# Patient Record
Sex: Female | Born: 1970 | Hispanic: Yes | Marital: Married | Smoking: Never smoker
Health system: Southern US, Community
[De-identification: ages and names within clinical notes are randomized; demographics above are authoritative.]

## PROBLEM LIST (undated history)

## (undated) HISTORY — PX: CHOLECYSTECTOMY: SHX55

---

## 2016-03-02 ENCOUNTER — Emergency Department
Admission: EM | Admit: 2016-03-02 | Discharge: 2016-03-02 | Disposition: A | Discharge status: Home / Self Care | Payer: BLUE CROSS/BLUE SHIELD | Source: Home / Self Care | Attending: Family Medicine | Admitting: Family Medicine

## 2016-03-02 ENCOUNTER — Encounter: Payer: Self-pay | Admitting: Emergency Medicine

## 2016-03-02 DIAGNOSIS — R059 Cough, unspecified: Secondary | ICD-10-CM

## 2016-03-02 DIAGNOSIS — Z9109 Other allergy status, other than to drugs and biological substances: Secondary | ICD-10-CM

## 2016-03-02 DIAGNOSIS — Z91048 Other nonmedicinal substance allergy status: Secondary | ICD-10-CM | POA: Diagnosis not present

## 2016-03-02 DIAGNOSIS — R05 Cough: Secondary | ICD-10-CM | POA: Diagnosis not present

## 2016-03-02 DIAGNOSIS — R0981 Nasal congestion: Secondary | ICD-10-CM | POA: Diagnosis not present

## 2016-03-02 MED ORDER — BENZONATATE 100 MG PO CAPS: 100.0000 mg | ORAL_CAPSULE | Freq: Three times a day (TID) | ORAL | Status: DC

## 2016-03-02 MED ORDER — PSEUDOEPHEDRINE-GUAIFENESIN 60-400 MG PO TABS: ORAL_TABLET | ORAL | Status: DC

## 2016-03-02 MED ORDER — FLUTICASONE PROPIONATE 50 MCG/ACT NA SUSP: 2.0000 | Freq: Every day | NASAL | Status: AC

## 2016-03-02 NOTE — ED Provider Notes (Signed)
CSN: [id number]     Arrival date & time 03/02/16  1043 History   First MD Initiated Contact with Patient 03/02/16 1101     Chief Complaint  Patient presents with  . Cough   (Consider location/radiation/quality/duration/timing/severity/associated sxs/prior Treatment) HPI  The pt is a 45yo female presenting to [facility] with c/o 3 days of nasal congestion, mild intermittent dry cough, itchy scratchy throat and slight chills yesterday.  She has been taking Zyrtec without relief. Husband states he has been having symptoms due to his seasonal allergies.  Cough and throat are most bothersome for the pt. Mild to moderate in severity. Denies n/v/d.   History reviewed. No pertinent past medical history. History reviewed. No pertinent past surgical history. No family history on file. Social History  Substance Use Topics  . Smoking status: Never Smoker   . Smokeless tobacco: None  . Alcohol Use: No   OB History    No data available     Review of Systems  Constitutional: Positive for chills. Negative for fever.  HENT: Positive for congestion, rhinorrhea and sore throat. Negative for ear pain, trouble swallowing and voice change.   Respiratory: Positive for cough. Negative for shortness of breath.   Cardiovascular: Negative for chest pain and palpitations.  Gastrointestinal: Negative for nausea, vomiting, abdominal pain and diarrhea.  Musculoskeletal: Negative for myalgias, back pain and arthralgias.  Skin: Negative for rash.    Allergies  Review of patient's allergies indicates no known allergies.  Home Medications   Prior to Admission medications   Medication Sig Start Date End Date Taking? Authorizing Provider  cetirizine (ZYRTEC) 10 MG tablet Take 10 mg by mouth daily.   Yes Historical Provider, MD  benzonatate (TESSALON) 100 MG capsule Take 1-2 capsules (100-200 mg total) by mouth every 8 (eight) hours. 03/02/16   Junius Finner, PA-C  fluticasone (FLONASE) 50 MCG/ACT nasal spray Place 2  sprays into both nostrils daily. 03/02/16   Junius Finner, PA-C  Pseudoephedrine-Guaifenesin 60-400 MG TABS 1 tab by mouth every 6-8 hours for cough and congestion. Take with full glass of water. 03/02/16   Junius Finner, PA-C   Meds Ordered and Administered this Visit  Medications - No data to display  BP 124/79 mmHg  Pulse 79  Temp(Src) 98.5 F (36.9 C) (Oral)  Ht  (1.549 m)  Wt 181 lb (82.101 kg)  BMI 34.22 kg/m2  SpO2 98%  LMP 03/01/2016 No data found.   Physical Exam  Constitutional: She appears well-developed and well-nourished. No distress.  HENT:  Head: Normocephalic and atraumatic.  Right Ear: Tympanic membrane normal.  Left Ear: Tympanic membrane normal.  Nose: Mucosal edema and rhinorrhea present. Right sinus exhibits no maxillary sinus tenderness and no frontal sinus tenderness. Left sinus exhibits no maxillary sinus tenderness and no frontal sinus tenderness.  Mouth/Throat: Uvula is midline and mucous membranes are normal. Posterior oropharyngeal erythema present. No oropharyngeal exudate, posterior oropharyngeal edema or tonsillar abscesses.  Eyes: Conjunctivae are normal. No scleral icterus.  Neck: Normal range of motion. Neck supple.  Cardiovascular: Normal rate, regular rhythm and normal heart sounds.   Pulmonary/Chest: Effort normal and breath sounds normal. No stridor. No respiratory distress. She has no wheezes. She has no rales.  Abdominal: Soft. She exhibits no distension. There is no tenderness.  Musculoskeletal: Normal range of motion.  Lymphadenopathy:    She has no cervical adenopathy.  Neurological: She is alert.  Skin: Skin is warm and dry. She is not diaphoretic.  Nursing note and vitals  reviewed.   ED Course  Procedures (including critical care time)  Labs Review Labs Reviewed - No data to display  Imaging Review No results found.   MDM   1. Cough   2. Nasal congestion   3. Environmental allergies    Pt c/o mild cough with sinus  congestion and scratchy throat. No evidence of bacterial infection on exam. Symptoms likely viral vs seasonal/environmental allergies. Encouraged symptomatic treatment.   Rx: tessalon, Flonase, and pseudoephedrine-guaifenesin  Encouraged fluids and rest. F/u with PCP in 1 week if not improving, sooner if worsening. Patient verbalized understanding and agreement with treatment plan.     Junius Finnerrin O'Malley, PA-C 03/02/16 1118

## 2016-03-02 NOTE — ED Notes (Signed)
Cough, itchy throat, runny nose, congestion x 3 days, slight chills yeserday

## 2016-05-03 ENCOUNTER — Emergency Department
Admission: EM | Admit: 2016-05-03 | Discharge: 2016-05-03 | Disposition: A | Discharge status: Home / Self Care | Payer: BLUE CROSS/BLUE SHIELD | Source: Home / Self Care | Attending: Family Medicine | Admitting: Family Medicine

## 2016-05-03 ENCOUNTER — Emergency Department (INDEPENDENT_AMBULATORY_CARE_PROVIDER_SITE_OTHER): Payer: BLUE CROSS/BLUE SHIELD

## 2016-05-03 ENCOUNTER — Encounter: Payer: Self-pay | Admitting: *Deleted

## 2016-05-03 DIAGNOSIS — S99911A Unspecified injury of right ankle, initial encounter: Secondary | ICD-10-CM | POA: Diagnosis not present

## 2016-05-03 DIAGNOSIS — W109XXA Fall (on) (from) unspecified stairs and steps, initial encounter: Secondary | ICD-10-CM

## 2016-05-03 DIAGNOSIS — S93401A Sprain of unspecified ligament of right ankle, initial encounter: Secondary | ICD-10-CM

## 2016-05-03 NOTE — ED Provider Notes (Signed)
CSN: [id number]     Arrival date & time 05/03/16  1056 History   First MD Initiated Contact with Patient 05/03/16 1115     Chief Complaint  Patient presents with  . Ankle Pain      HPI Comments: Patient twisted her right ankle on stairs 30 minutes ago.  Patient is a 45 y.o. female presenting with ankle pain. The history is provided by the patient and a relative.  Ankle Pain Location:  Ankle Time since incident:  30 minutes Injury: yes   Mechanism of injury: fall   Fall:    Fall occurred:  Down stairs   Impact surface:  Hard floor   Entrapped after fall: no   Ankle location:  R ankle Pain details:    Quality:  Aching   Radiates to:  Does not radiate   Severity:  Moderate   Onset quality:  Sudden   Progression:  Unchanged Chronicity:  New Prior injury to area:  No Relieved by:  None tried Worsened by:  Bearing weight Ineffective treatments:  None tried Associated symptoms: decreased ROM, stiffness and swelling   Associated symptoms: no muscle weakness, no numbness and no tingling     History reviewed. No pertinent past medical history. History reviewed. No pertinent past surgical history. History reviewed. No pertinent family history. Social History  Substance Use Topics  . Smoking status: Never Smoker   . Smokeless tobacco: None  . Alcohol Use: No   OB History    No data available     Review of Systems  Musculoskeletal: Positive for stiffness.  All other systems reviewed and are negative.   Allergies  Review of patient's allergies indicates no known allergies.  Home Medications   Prior to Admission medications   Medication Sig Start Date End Date Taking? Authorizing Provider  cetirizine (ZYRTEC) 10 MG tablet Take 10 mg by mouth daily.    Historical Provider, MD  fluticasone (FLONASE) 50 MCG/ACT nasal spray Place 2 sprays into both nostrils daily. 03/02/16   Junius FinnerErin O'Malley, PA-C   Meds Ordered and Administered this Visit  Medications - No data to  display  BP 115/76 mmHg  Pulse 72  Temp(Src) 97.7 F (36.5 C) (Oral)  Resp 16  Ht 5\' 3"  (1.6 m)  Wt 180 lb (81.647 kg)  BMI 31.89 kg/m2  SpO2 97%  LMP 04/02/2016 No data found.   Physical Exam  Constitutional: She is oriented to person, place, and time. She appears well-developed and well-nourished. No distress.  HENT:  Head: Atraumatic.  Eyes: Pupils are equal, round, and reactive to light.  Musculoskeletal:       Right ankle: She exhibits decreased range of motion. She exhibits no swelling, no ecchymosis, no deformity, no laceration and normal pulse. Tenderness. Lateral malleolus and AITFL tenderness found. No medial malleolus, no head of 5th metatarsal and no proximal fibula tenderness found. Achilles tendon normal.       Feet:   Right ankle:  Decreased range of motion.  Tenderness and swelling over the lateral malleolus.  Joint stable.  No tenderness over the base of the fifth metatarsal.  Distal neurovascular function is intact.   Neurological: She is alert and oriented to person, place, and time.  Skin: Skin is warm and dry.  Nursing note and vitals reviewed.   ED Course  Procedures none   Imaging Review Dg Ankle Complete Right  05/03/2016  CLINICAL DATA:  45 year old female with right lateral ankle pain after falling down stairs this morning. Initial  encounter. EXAM: RIGHT ANKLE - COMPLETE 3+ VIEW COMPARISON:  None. FINDINGS: Bone mineralization is within normal limits. Preserved mortise joint alignment. Talar dome intact. No joint effusion identified. Calcaneus intact with plantar surface degenerative spurring. Distal tibia and fibula appear intact. No fracture identified in the right foot. There is anterior soft tissue swelling and stranding along the ankle. IMPRESSION: Anterior soft tissue injury with no acute fracture or dislocation identified about the right ankle. Electronically Signed   By: Odessa Fleming M.D.   On: 05/03/2016 11:41       MDM   1. Right ankle  sprain, initial encounter     Ace wrap applied.  Dispensed crutches and AirCast stirrup splint.  Apply ice pack for 30 minutes every 1 to 2 hours today and tomorrow.  Elevate.  Use crutches for 3 to 5 days.  Wear Ace wrap until swelling decreases.  Wear brace for about 2 to 3 weeks.  Begin range of motion and stretching exercises in about 5 days as per instruction sheet.  May take Ibuprofen , 4 tabs every 8 hours with food.  Followup with Dr. Rodney Langton or Dr. Clementeen [facility] ([facility]) if not improving about two weeks.    Lattie Haw, MD 05/03/16 1201

## 2016-05-03 NOTE — ED Notes (Signed)
Pt c/o RT ankle pain post fall down the stairs at home at 1045 today. No OTC meds.

## 2016-05-03 NOTE — Discharge Instructions (Signed)
Apply ice pack for 30 minutes every 1 to 2 hours today and tomorrow.  Elevate.  Use crutches for 3 to 5 days.  Wear Ace wrap until swelling decreases.  Wear brace for about 2 to 3 weeks.  Begin range of motion and stretching exercises in about 5 days as per instruction sheet.  May take Ibuprofen 200mg, 4 tabs every 8 hours with food.  ° ° °Ankle Sprain °An ankle sprain is an injury to the strong, fibrous tissues (ligaments) that hold the bones of your ankle joint together.  °CAUSES °An ankle sprain is usually caused by a fall or by twisting your ankle. Ankle sprains most commonly occur when you step on the outer edge of your foot, and your ankle turns inward. People who participate in sports are more prone to these types of injuries.  °SYMPTOMS  °· Pain in your ankle. The pain may be present at rest or only when you are trying to stand or walk. °· Swelling. °· Bruising. Bruising may develop immediately or within 1 to 2 days after your injury. °· Difficulty standing or walking, particularly when turning corners or changing directions. °DIAGNOSIS  °Your caregiver will ask you details about your injury and perform a physical exam of your ankle to determine if you have an ankle sprain. During the physical exam, your caregiver will press on and apply pressure to specific areas of your foot and ankle. Your caregiver will try to move your ankle in certain ways. An X-ray exam may be done to be sure a bone was not broken or a ligament did not separate from one of the bones in your ankle (avulsion fracture).  °TREATMENT  °Certain types of braces can help stabilize your ankle. Your caregiver can make a recommendation for this. Your caregiver may recommend the use of medicine for pain. If your sprain is severe, your caregiver may refer you to a surgeon who helps to restore function to parts of your skeletal system (orthopedist) or a physical therapist. °HOME CARE INSTRUCTIONS  °· Apply ice to your injury for 1-2 days or as  directed by your caregiver. Applying ice helps to reduce inflammation and pain. °¨ Put ice in a plastic bag. °¨ Place a towel between your skin and the bag. °¨ Leave the ice on for 15-20 minutes at a time, every 2 hours while you are awake. °· Only take over-the-counter or prescription medicines for pain, discomfort, or fever as directed by your caregiver. °· Elevate your injured ankle above the level of your heart as much as possible for 2-3 days. °· If your caregiver recommends crutches, use them as instructed. Gradually put weight on the affected ankle. Continue to use crutches or a cane until you can walk without feeling pain in your ankle. °· If you have a plaster splint, wear the splint as directed by your caregiver. Do not rest it on anything harder than a pillow for the first 24 hours. Do not put weight on it. Do not get it wet. You may take it off to take a shower or bath. °· You may have been given an elastic bandage to wear around your ankle to provide support. If the elastic bandage is too tight (you have numbness or tingling in your foot or your foot becomes cold and blue), adjust the bandage to make it comfortable. °· If you have an air splint, you may blow more air into it or let air out to make it more comfortable. You may take   your splint off at night and before taking a shower or bath. Wiggle your toes in the splint several times per day to decrease swelling. °SEEK MEDICAL CARE IF:  °· You have rapidly increasing bruising or swelling. °· Your toes feel extremely cold or you lose feeling in your foot. °· Your pain is not relieved with medicine. °SEEK IMMEDIATE MEDICAL CARE IF: °· Your toes are numb or blue. °· You have severe pain that is increasing. °MAKE SURE YOU:  °· Understand these instructions. °· Will watch your condition. °· Will get help right away if you are not doing well or get worse. °  °This information is not intended to replace advice given to you by your health care provider. Make  sure you discuss any questions you have with your health care provider. °  °Document Released: 10/31/2005 Document Revised: 11/21/2014 Document Reviewed: 11/12/2011 °Elsevier Interactive Patient Education ©2016 [employer] ° °

## 2018-05-16 ENCOUNTER — Other Ambulatory Visit: Payer: Self-pay

## 2018-05-16 ENCOUNTER — Encounter (HOSPITAL_BASED_OUTPATIENT_CLINIC_OR_DEPARTMENT_OTHER): Payer: Self-pay | Admitting: Emergency Medicine

## 2018-05-16 ENCOUNTER — Emergency Department (HOSPITAL_BASED_OUTPATIENT_CLINIC_OR_DEPARTMENT_OTHER): Payer: No Typology Code available for payment source

## 2018-05-16 ENCOUNTER — Emergency Department (HOSPITAL_BASED_OUTPATIENT_CLINIC_OR_DEPARTMENT_OTHER)
Admission: EM | Admit: 2018-05-16 | Discharge: 2018-05-16 | Disposition: A | Discharge status: Home / Self Care | Payer: No Typology Code available for payment source | Attending: Emergency Medicine | Admitting: Emergency Medicine

## 2018-05-16 DIAGNOSIS — Y92410 Unspecified street and highway as the place of occurrence of the external cause: Secondary | ICD-10-CM | POA: Insufficient documentation

## 2018-05-16 DIAGNOSIS — Z79899 Other long term (current) drug therapy: Secondary | ICD-10-CM | POA: Insufficient documentation

## 2018-05-16 DIAGNOSIS — Y939 Activity, unspecified: Secondary | ICD-10-CM | POA: Insufficient documentation

## 2018-05-16 DIAGNOSIS — W2210XA Striking against or struck by unspecified automobile airbag, initial encounter: Secondary | ICD-10-CM

## 2018-05-16 DIAGNOSIS — Y998 Other external cause status: Secondary | ICD-10-CM | POA: Insufficient documentation

## 2018-05-16 DIAGNOSIS — S50811A Abrasion of right forearm, initial encounter: Secondary | ICD-10-CM | POA: Insufficient documentation

## 2018-05-16 DIAGNOSIS — S59911A Unspecified injury of right forearm, initial encounter: Secondary | ICD-10-CM | POA: Diagnosis present

## 2018-05-16 MED ORDER — METHOCARBAMOL 500 MG PO TABS: 500.0000 mg | ORAL_TABLET | Freq: Three times a day (TID) | ORAL | 0 refills | Status: AC
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
## 2018-05-16 NOTE — ED Notes (Signed)
ED Provider at bedside. 
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
## 2018-05-16 NOTE — ED Notes (Signed)
Patient transported to X-ray 
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
